# Patient Record
Sex: Female | Born: 1975
Health system: Southern US, Community
[De-identification: ages and names within clinical notes are randomized; demographics above are authoritative.]

## PROBLEM LIST (undated history)

## (undated) DIAGNOSIS — G4730 Sleep apnea, unspecified: Secondary | ICD-10-CM

## (undated) HISTORY — PX: TONSILLECTOMY AND ADENOIDECTOMY: SUR1326

## (undated) HISTORY — DX: Sleep apnea, unspecified: G47.30

---

## 2020-01-28 DIAGNOSIS — G4733 Obstructive sleep apnea (adult) (pediatric): Secondary | ICD-10-CM | POA: Diagnosis not present

## 2020-05-04 DIAGNOSIS — G4733 Obstructive sleep apnea (adult) (pediatric): Secondary | ICD-10-CM | POA: Diagnosis not present

## 2020-06-19 DIAGNOSIS — G4733 Obstructive sleep apnea (adult) (pediatric): Secondary | ICD-10-CM | POA: Diagnosis not present

## 2020-08-03 DIAGNOSIS — G4733 Obstructive sleep apnea (adult) (pediatric): Secondary | ICD-10-CM | POA: Diagnosis not present

## 2020-09-05 DIAGNOSIS — G4733 Obstructive sleep apnea (adult) (pediatric): Secondary | ICD-10-CM | POA: Diagnosis not present

## 2020-10-23 ENCOUNTER — Other Ambulatory Visit (HOSPITAL_COMMUNITY)
Admission: RE | Admit: 2020-10-23 | Discharge: 2020-10-23 | Disposition: A | Discharge status: Home / Self Care | Payer: BC Managed Care – PPO | Source: Ambulatory Visit | Attending: Obstetrics & Gynecology | Admitting: Obstetrics & Gynecology

## 2020-10-23 ENCOUNTER — Encounter: Payer: Self-pay | Admitting: Obstetrics & Gynecology

## 2020-10-23 ENCOUNTER — Other Ambulatory Visit: Payer: Self-pay

## 2020-10-23 ENCOUNTER — Ambulatory Visit (INDEPENDENT_AMBULATORY_CARE_PROVIDER_SITE_OTHER): Payer: BC Managed Care – PPO | Admitting: Obstetrics & Gynecology

## 2020-10-23 VITALS — BP 111/72 | HR 80 | Ht 68.0 in | Wt 249.0 lb

## 2020-10-23 DIAGNOSIS — Z01419 Encounter for gynecological examination (general) (routine) without abnormal findings: Secondary | ICD-10-CM | POA: Insufficient documentation

## 2020-10-23 DIAGNOSIS — Z1231 Encounter for screening mammogram for malignant neoplasm of breast: Secondary | ICD-10-CM

## 2020-10-23 DIAGNOSIS — N926 Irregular menstruation, unspecified: Secondary | ICD-10-CM | POA: Diagnosis not present

## 2020-10-23 DIAGNOSIS — Z3009 Encounter for other general counseling and advice on contraception: Secondary | ICD-10-CM

## 2020-10-23 DIAGNOSIS — Z6837 Body mass index (BMI) 37.0-37.9, adult: Secondary | ICD-10-CM | POA: Diagnosis not present

## 2020-10-23 DIAGNOSIS — Z713 Dietary counseling and surveillance: Secondary | ICD-10-CM

## 2020-10-23 LAB — POCT URINE PREGNANCY: Preg Test, Ur: NEGATIVE

## 2020-10-23 NOTE — Progress Notes (Signed)
Subjective:     Glenda Baker is a 45 y.o. female here for a routine exam. LMP 12/3/202. Pt reports that she normally has monthly cycles but in the past 6 months she has had 2 cycles that were delayed. Her normal cycle length is 3 days but, on those 2 episodes her cycle lasted 6 days. Her cycles are normally light. She is a [id number] with h/o SVD x3. She breast fed all of her kids for on average 11 months.    Pt moved from [place] due to her husbands job. She is a Scientist, research (life sciences). She reports that she has used natural family planning for contraception but, over the past 6 months, she has had 2 delayed menses. She is concerned as they are not in a place where they want to conceive. Pt reports a weight gain of 25 pounds since the COVID pandemic began.     Pts friend was recently dx'd with breast cancer. She wants to be screened. Pt denies family history of any female malignancies.       Gynecologic History Patient's last menstrual period was 09/01/2020. Contraception: Natrural Family Planning/rhythm   Last Pap: 8 years prev. Results were: normal Last mammogram: never had.   Obstetric History OB History  Gravida Para Term Preterm AB Living  3 3 3     3   SAB IAB Ectopic Multiple Live Births          3    # Outcome Date GA Lbr Len/2nd Weight Sex Delivery Anes PTL Lv  3 Term 2013 [id number]   M Vag-Spont None N LIV  2 Term 2011 [redacted]w[redacted]d   F Vag-Spont None N LIV  1 Term 2010 [redacted]w[redacted]d   F Vag-Spont None N LIV   The following portions of the patient's history were reviewed and updated as appropriate: allergies, current medications, past family history, past medical history, past social history, past surgical history and problem list.  Review of Systems Pertinent items are noted in HPI.    Objective:  BP 111/72   Pulse 80   Ht 5\' 8"  (1.727 m)   Wt 249 lb (112.9 kg)   LMP 09/01/2020   BMI 37.86 kg/m  General Appearance:    Alert, cooperative, no distress, appears stated age  Head:     Normocephalic, without obvious abnormality, atraumatic  Eyes:    conjunctiva/corneas clear, EOM's intact, both eyes  Ears:    Normal external ear canals, both ears  Nose:   Nares normal, septum midline, mucosa normal, no drainage    or sinus tenderness  Throat:   Lips, mucosa, and tongue normal; teeth and gums normal  Neck:   Supple, symmetrical, trachea midline, no adenopathy;    thyroid:  no enlargement/tenderness/nodules  Back:     Symmetric, no curvature, ROM normal, no CVA tenderness  Lungs:     respirations unlabored  Chest Wall:    No tenderness or deformity   Heart:    Regular rate and rhythm  Breast Exam:    No tenderness, masses, or nipple abnormality  Abdomen:     Soft, non-tender, bowel sounds active all four quadrants,    no masses, no organomegaly  Genitalia:    Normal female without lesion, discharge or tenderness     Extremities:   Extremities normal, atraumatic, no cyanosis or edema  Pulses:   2+ and symmetric all extremities  Skin:   Skin color, texture, turgor normal, no rashes or lesions      Assessment:  Healthy female exam.   AUB/irreg cycles- UPT neg Contraception counseling BMI of >37   Plan:   Glenda Baker was seen today for gynecologic exam.  Diagnoses and all orders for this visit:  Well female exam with routine gynecological exam -     Cytology - PAP( [place]) -     Lipid Profile -     CBC -     TSH+Prl+TestT+TestF+17OHP -     Hemoglobin A1c -     FSH  Breast cancer screening by mammogram -     Cancel: MM DIGITAL SCREENING BILATERAL; Future -     MM 3D SCREEN BREAST BILATERAL; Future  Abnormal menstrual periods -     POCT urine pregnancy -     Lipid Profile -     CBC -     TSH+Prl+TestT+TestF+17OHP -     Hemoglobin A1c -     FSH  BMI 37.0-37.9, adult  Encounter for counseling regarding contraception  Weight loss counseling, encounter for  f/u in 2 weeks to review labs and readdress cycles. Pt plans to sign up for [employer].  F/u in 1 year for annual   Glenda Baker, M.D., [place] [place]

## 2020-10-24 LAB — CYTOLOGY - PAP
Comment: NEGATIVE
Diagnosis: NEGATIVE
High risk HPV: NEGATIVE

## 2020-10-28 LAB — CBC
Hematocrit: 38.3 % (ref 34.0–46.6)
Hemoglobin: 13.4 g/dL (ref 11.1–15.9)
MCH: 30.2 pg (ref 26.6–33.0)
MCHC: 35 g/dL (ref 31.5–35.7)
MCV: 86 fL (ref 79–97)
Platelets: 372 10*3/uL (ref 150–450)
RBC: 4.44 x10E6/uL (ref 3.77–5.28)
RDW: 12.5 % (ref 11.7–15.4)
WBC: 8.8 10*3/uL (ref 3.4–10.8)

## 2020-10-28 LAB — TSH+PRL+TESTT+TESTF+17OHP
17-Hydroxyprogesterone: 121 ng/dL
Prolactin: 13.3 ng/mL (ref 4.8–23.3)
TSH: 3.44 u[IU]/mL (ref 0.450–4.500)
Testosterone, Free: 3.2 pg/mL (ref 0.0–4.2)
Testosterone, Total, LC/MS: 16.3 ng/dL

## 2020-10-28 LAB — HEMOGLOBIN A1C
Est. average glucose Bld gHb Est-mCnc: 114 mg/dL
Hgb A1c MFr Bld: 5.6 % (ref 4.8–5.6)

## 2020-10-28 LAB — LIPID PANEL
Chol/HDL Ratio: 4.1 ratio (ref 0.0–4.4)
Cholesterol, Total: 168 mg/dL (ref 100–199)
HDL: 41 mg/dL (ref >39)
LDL Chol Calc (NIH): 103 mg/dL — ABNORMAL HIGH (ref 0–99)
Triglycerides: 132 mg/dL (ref 0–149)
VLDL Cholesterol Cal: 24 mg/dL (ref 5–40)

## 2020-10-28 LAB — FOLLICLE STIMULATING HORMONE: FSH: 2.6 m[IU]/mL

## 2020-10-30 ENCOUNTER — Encounter (HOSPITAL_BASED_OUTPATIENT_CLINIC_OR_DEPARTMENT_OTHER): Payer: Self-pay

## 2020-10-30 ENCOUNTER — Ambulatory Visit (HOSPITAL_BASED_OUTPATIENT_CLINIC_OR_DEPARTMENT_OTHER)
Admission: RE | Admit: 2020-10-30 | Discharge: 2020-10-30 | Disposition: A | Discharge status: Home / Self Care | Payer: BC Managed Care – PPO | Source: Ambulatory Visit | Attending: Obstetrics & Gynecology | Admitting: Obstetrics & Gynecology

## 2020-10-30 ENCOUNTER — Other Ambulatory Visit: Payer: Self-pay

## 2020-10-30 DIAGNOSIS — Z1231 Encounter for screening mammogram for malignant neoplasm of breast: Secondary | ICD-10-CM | POA: Diagnosis not present

## 2020-12-06 DIAGNOSIS — G4733 Obstructive sleep apnea (adult) (pediatric): Secondary | ICD-10-CM | POA: Diagnosis not present

## 2021-03-09 DIAGNOSIS — G4733 Obstructive sleep apnea (adult) (pediatric): Secondary | ICD-10-CM | POA: Diagnosis not present

## 2021-06-11 DIAGNOSIS — G4733 Obstructive sleep apnea (adult) (pediatric): Secondary | ICD-10-CM | POA: Diagnosis not present

## 2021-09-11 DIAGNOSIS — G4733 Obstructive sleep apnea (adult) (pediatric): Secondary | ICD-10-CM | POA: Diagnosis not present

## 2021-10-23 ENCOUNTER — Encounter: Payer: Self-pay | Admitting: Obstetrics & Gynecology

## 2021-10-24 ENCOUNTER — Other Ambulatory Visit: Payer: Self-pay

## 2021-10-24 ENCOUNTER — Encounter: Payer: Self-pay | Admitting: Obstetrics & Gynecology

## 2021-10-24 ENCOUNTER — Ambulatory Visit (INDEPENDENT_AMBULATORY_CARE_PROVIDER_SITE_OTHER): Payer: BC Managed Care – PPO | Admitting: Obstetrics & Gynecology

## 2021-10-24 VITALS — BP 112/72 | HR 68 | Ht 68.0 in | Wt 240.0 lb

## 2021-10-24 DIAGNOSIS — B3731 Acute candidiasis of vulva and vagina: Secondary | ICD-10-CM | POA: Diagnosis not present

## 2021-10-24 DIAGNOSIS — Z1231 Encounter for screening mammogram for malignant neoplasm of breast: Secondary | ICD-10-CM | POA: Diagnosis not present

## 2021-10-24 DIAGNOSIS — Z01419 Encounter for gynecological examination (general) (routine) without abnormal findings: Secondary | ICD-10-CM

## 2021-10-24 MED ORDER — FLUCONAZOLE 150 MG PO TABS: 150.0000 mg | ORAL_TABLET | Freq: Once | ORAL | 3 refills | Status: AC

## 2021-10-24 NOTE — Progress Notes (Signed)
Patient complaining of vaginal itching 

## 2021-10-24 NOTE — Progress Notes (Signed)
Subjective:     Glenda Baker is a 46 y.o. female here for a routine exam.  Current complaints: Pt reports some vulvar itching.       Gynecologic History No LMP recorded. (Menstrual status: Irregular Periods). Contraception: rhythm method Last Pap: 10/23/2020. Results were: normal Last mammogram: 10/30/2020. Results were: normal  Obstetric History OB History  Gravida Para Term Preterm AB Living  3 3 3     3   SAB IAB Ectopic Multiple Live Births          3    # Outcome Date GA Lbr Len/2nd Weight Sex Delivery Anes PTL Lv  3 Term 2013 [redacted]w[redacted]d   M Vag-Spont None N LIV  2 Term 2011 [redacted]w[redacted]d   F Vag-Spont None N LIV  1 Term 2010 [redacted]w[redacted]d   F Vag-Spont None N LIV     The following portions of the patient's history were reviewed and updated as appropriate: allergies, current medications, past family history, past medical history, past social history, past surgical history, and problem list.  Review of Systems Pertinent items are noted in HPI.    Objective:  BP 112/72    Pulse 68    Ht 5\' 8"  (1.727 m)    Wt 240 lb (108.9 kg)    BMI 36.49 kg/m   General Appearance:    Alert, cooperative, no distress, appears stated age  Head:    Normocephalic, without obvious abnormality, atraumatic  Eyes:    conjunctiva/corneas clear, EOM's intact, both eyes  Ears:    Normal external ear canals, both ears  Nose:   Nares normal, septum midline, mucosa normal, no drainage    or sinus tenderness  Throat:   Lips, mucosa, and tongue normal; teeth and gums normal  Neck:   Supple, symmetrical, trachea midline, no adenopathy;    thyroid:  no enlargement/tenderness/nodules  Back:     Symmetric, no curvature, ROM normal, no CVA tenderness  Lungs:     respirations unlabored  Chest Wall:    No tenderness or deformity   Heart:    Regular rate and rhythm  Breast Exam:    No tenderness, masses, or nipple abnormality  Abdomen:     Soft, non-tender, bowel sounds active all four quadrants,    no masses, no organomegaly   Genitalia:    Normal female without lesion, discharge or tenderness   White curd like vaginal discharge     Extremities:   Extremities normal, atraumatic, no cyanosis or edema  Pulses:   2+ and symmetric all extremities  Skin:   Skin color, texture, turgor normal, no rashes or lesions     Assessment:    Healthy female exam.  Yeast vulvovaginits.     Plan:   Diagnoses and all orders for this visit:  Well female exam with routine gynecological exam -     Hemoglobin A1c -     Comprehensive metabolic panel -     CBC  Vulvovaginitis due to yeast -     fluconazole (DIFLUCAN) 150 MG tablet; Take 1 tablet (150 mg total) by mouth once for 1 dose. Can take additional dose three days later if symptoms persist   Anti fungal powder on upper thigh.    Machaela Caterino L. Harraway-Smith, M.D., [redacted]w[redacted]d

## 2021-10-25 LAB — COMPREHENSIVE METABOLIC PANEL WITH GFR
ALT: 13 [IU]/L (ref 0–32)
AST: 11 [IU]/L (ref 0–40)
Albumin/Globulin Ratio: 2.3 — ABNORMAL HIGH (ref 1.2–2.2)
Albumin: 4.5 g/dL (ref 3.8–4.8)
Alkaline Phosphatase: 65 [IU]/L (ref 44–121)
BUN/Creatinine Ratio: 17 (ref 9–23)
BUN: 17 mg/dL (ref 6–24)
Bilirubin Total: 0.2 mg/dL (ref 0.0–1.2)
CO2: 25 mmol/L (ref 20–29)
Calcium: 9.3 mg/dL (ref 8.7–10.2)
Chloride: 103 mmol/L (ref 96–106)
Creatinine, Ser: 0.99 mg/dL (ref 0.57–1.00)
Globulin, Total: 2 g/dL (ref 1.5–4.5)
Glucose: 79 mg/dL (ref 70–99)
Potassium: 4.6 mmol/L (ref 3.5–5.2)
Sodium: 139 mmol/L (ref 134–144)
Total Protein: 6.5 g/dL (ref 6.0–8.5)
eGFR: 73 mL/min/{1.73_m2}

## 2021-10-25 LAB — CBC
Hematocrit: 38.8 % (ref 34.0–46.6)
Hemoglobin: 13.5 g/dL (ref 11.1–15.9)
MCH: 30.1 pg (ref 26.6–33.0)
MCHC: 34.8 g/dL (ref 31.5–35.7)
MCV: 86 fL (ref 79–97)
Platelets: 426 10*3/uL (ref 150–450)
RBC: 4.49 x10E6/uL (ref 3.77–5.28)
RDW: 12.7 % (ref 11.7–15.4)
WBC: 10.7 10*3/uL (ref 3.4–10.8)

## 2021-10-25 LAB — HEMOGLOBIN A1C
Est. average glucose Bld gHb Est-mCnc: 117 mg/dL
Hgb A1c MFr Bld: 5.7 % — ABNORMAL HIGH (ref 4.8–5.6)

## 2021-11-01 DIAGNOSIS — G4733 Obstructive sleep apnea (adult) (pediatric): Secondary | ICD-10-CM | POA: Diagnosis not present

## 2021-11-05 ENCOUNTER — Other Ambulatory Visit: Payer: Self-pay | Admitting: Obstetrics & Gynecology

## 2021-11-05 ENCOUNTER — Ambulatory Visit (HOSPITAL_BASED_OUTPATIENT_CLINIC_OR_DEPARTMENT_OTHER)
Admission: RE | Admit: 2021-11-05 | Discharge: 2021-11-05 | Disposition: A | Discharge status: Home / Self Care | Payer: BC Managed Care – PPO | Source: Ambulatory Visit | Attending: Obstetrics & Gynecology | Admitting: Obstetrics & Gynecology

## 2021-11-05 ENCOUNTER — Other Ambulatory Visit: Payer: Self-pay

## 2021-11-05 ENCOUNTER — Encounter (HOSPITAL_BASED_OUTPATIENT_CLINIC_OR_DEPARTMENT_OTHER): Payer: Self-pay

## 2021-11-05 DIAGNOSIS — Z1231 Encounter for screening mammogram for malignant neoplasm of breast: Secondary | ICD-10-CM | POA: Diagnosis not present

## 2021-11-05 DIAGNOSIS — B3731 Acute candidiasis of vulva and vagina: Secondary | ICD-10-CM

## 2021-11-05 MED ORDER — FLUCONAZOLE 150 MG PO TABS: 150.0000 mg | ORAL_TABLET | Freq: Once | ORAL | 1 refills | Status: AC

## 2021-11-06 ENCOUNTER — Encounter: Payer: Self-pay | Admitting: Obstetrics & Gynecology

## 2021-11-06 ENCOUNTER — Telehealth: Payer: Self-pay

## 2021-11-06 DIAGNOSIS — R7303 Prediabetes: Secondary | ICD-10-CM | POA: Insufficient documentation

## 2021-11-06 NOTE — Telephone Encounter (Signed)
-----   Message from Willodean Rosenthal, MD sent at 11/06/2021  7:48 AM EST ----- Please call pt. Her HgbA1C is right on the border of prediabetes. She needs to f/u with her primary care doc. If she doesn't have one, please give referral.   Thanks,   Clh-S

## 2021-11-06 NOTE — Telephone Encounter (Signed)
Phone rang several times and then disconnected. Unable to leave message. Will send my chart message.  Armandina Stammer RN

## 2021-12-03 IMAGING — MG MM DIGITAL SCREENING BILAT W/ TOMO AND CAD
8 series · 8 of 24 positions shown · non-contrast
Comparison: None.

CLINICAL DATA: Screening.

EXAM:
DIGITAL SCREENING BILATERAL MAMMOGRAM WITH TOMO AND CAD

[L CC synth-2D]
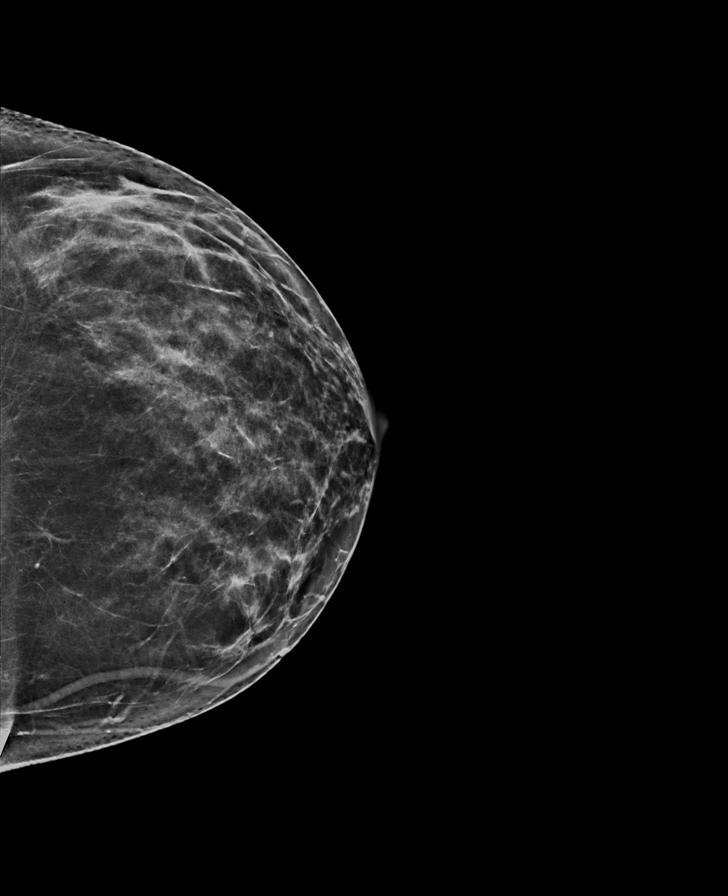

[L MLO synth-2D]
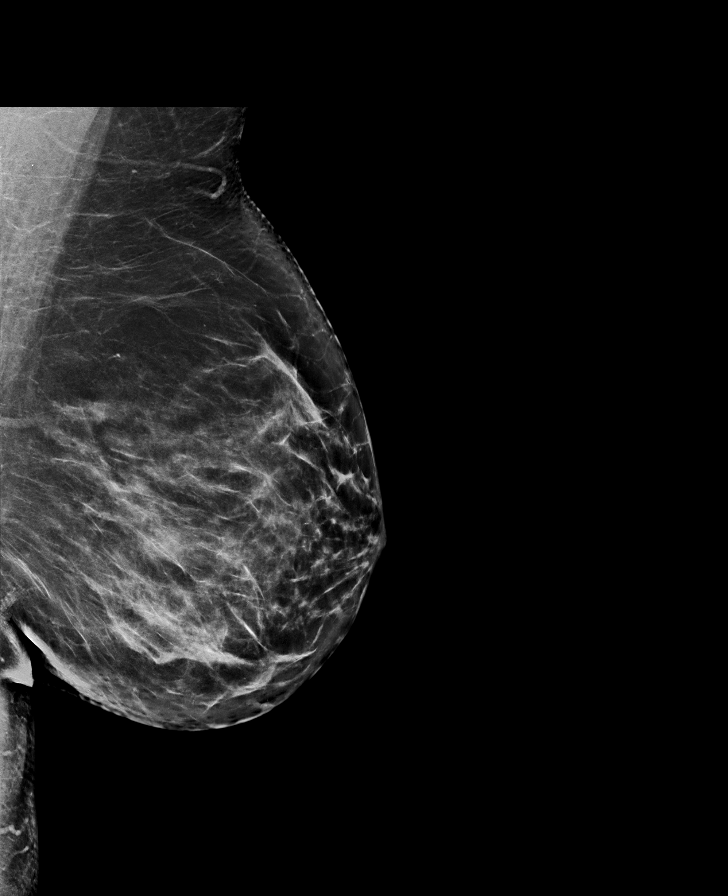

[R CC synth-2D]
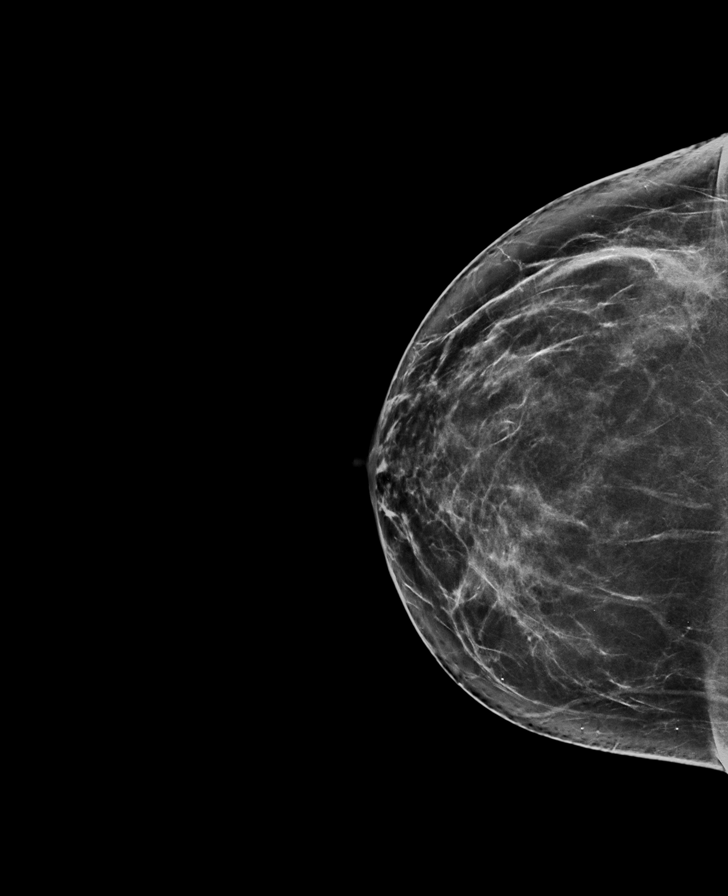

[R MLO synth-2D]
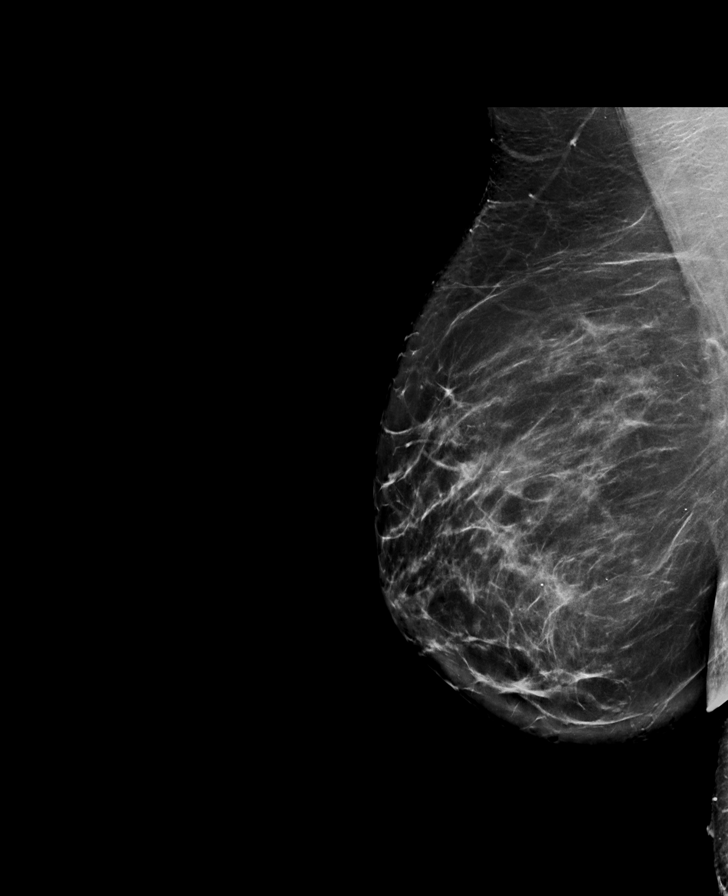

[R CC tomo · tomo slice 39/78.0]
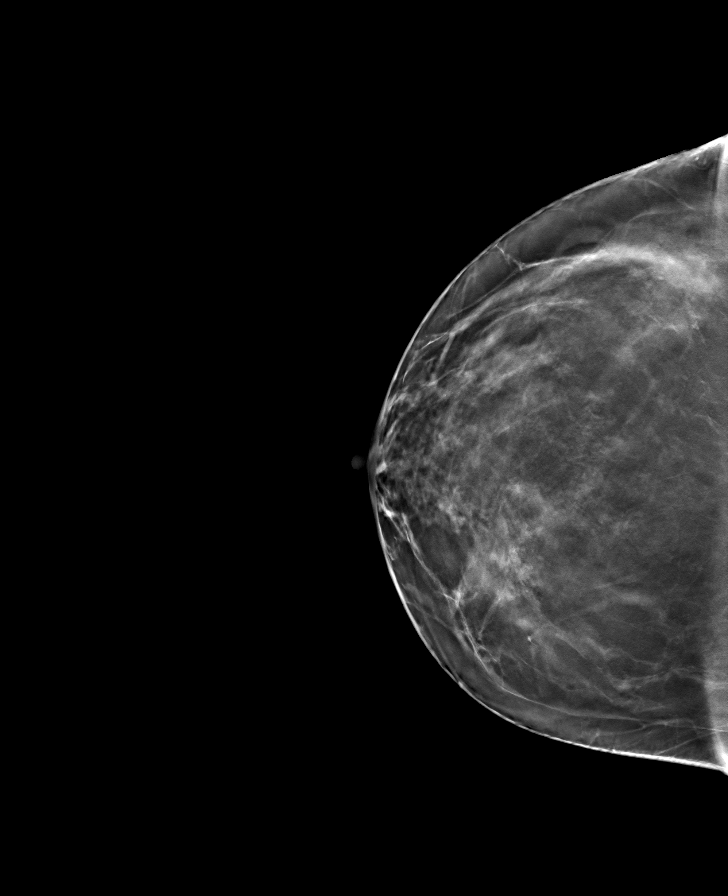

[R MLO tomo · tomo slice 47/92.0]
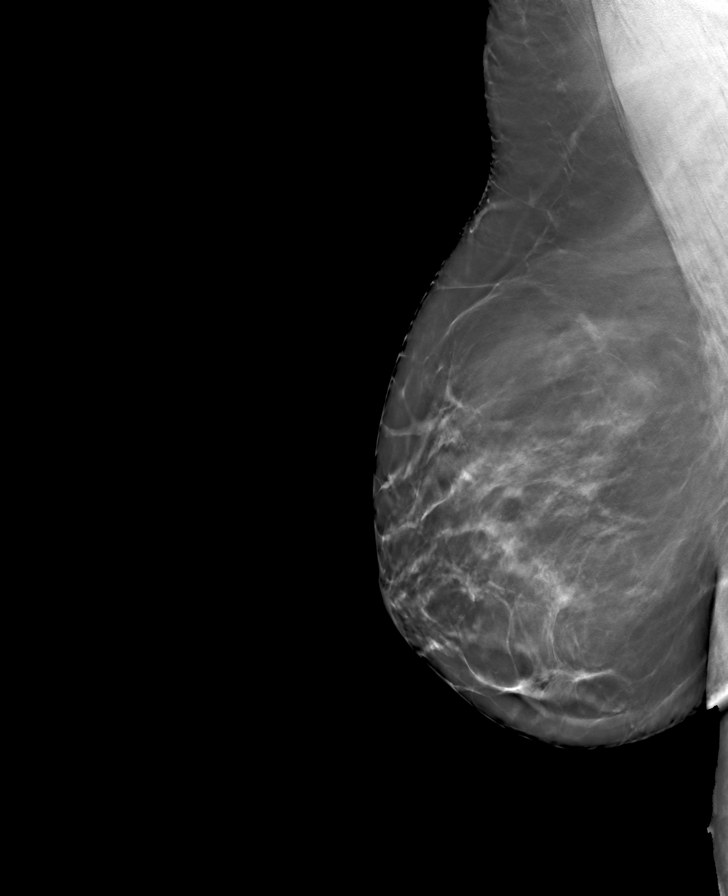

[L CC tomo · tomo slice 37/72.0]
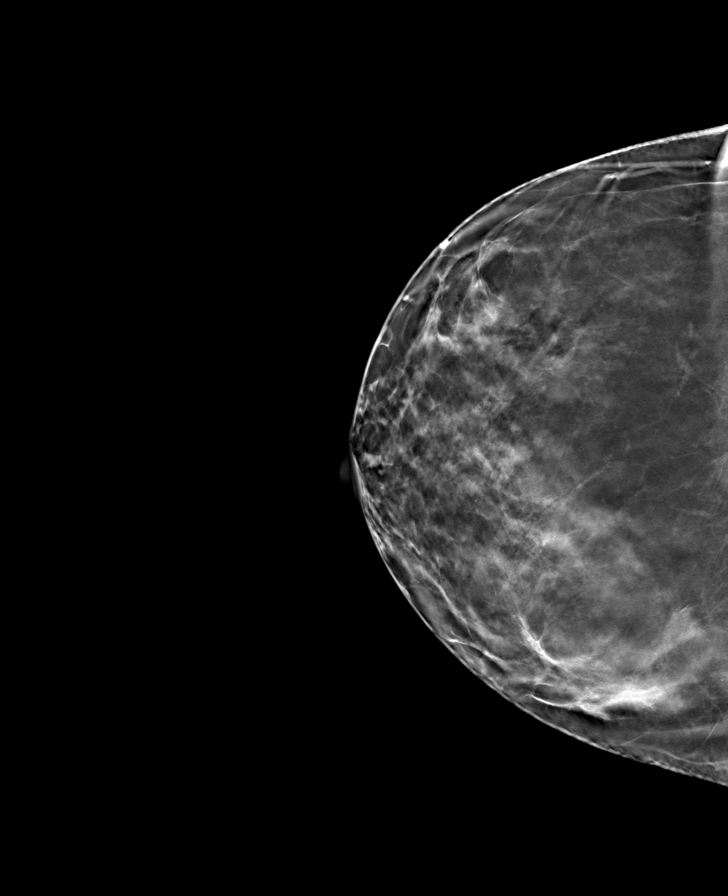

[L MLO tomo · tomo slice 45/90.0]
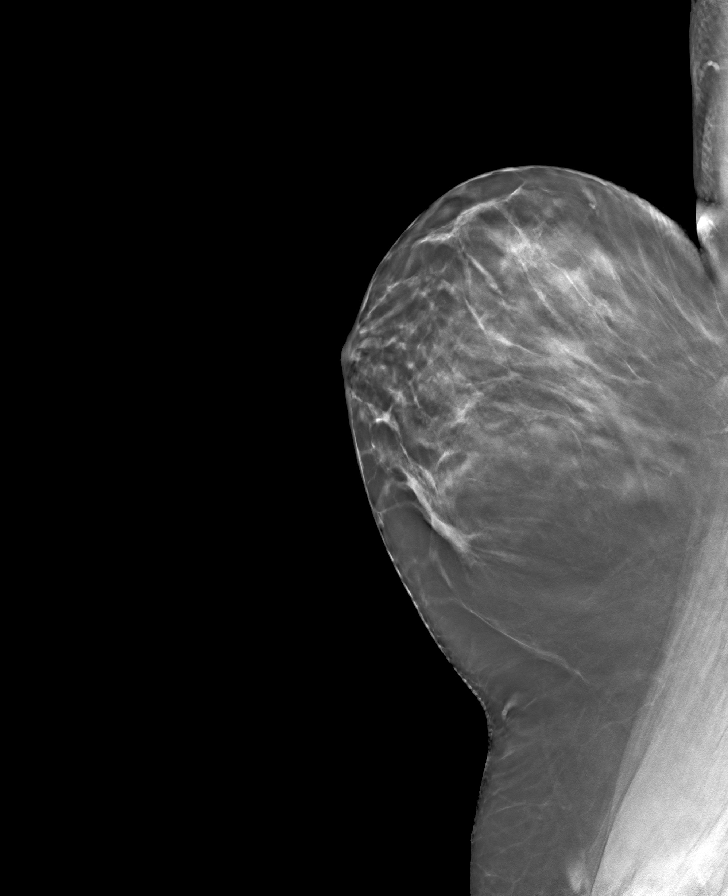

[8 of 24 positions shown; findings below may reference images not displayed]

ACR Breast Density Category c: The breast tissue is heterogeneously
dense, which may obscure small masses
FINDINGS: There are no findings suspicious for malignancy. The images were
evaluated with computer-aided detection.
IMPRESSION: No mammographic evidence of malignancy. A result letter of this
screening mammogram will be mailed directly to the patient.

RECOMMENDATION:
Screening mammogram in one year. (Code:FI-R-51M)

BI-RADS CATEGORY  1: Negative.

## 2022-01-29 DIAGNOSIS — G4733 Obstructive sleep apnea (adult) (pediatric): Secondary | ICD-10-CM | POA: Diagnosis not present

## 2022-06-07 DIAGNOSIS — G4733 Obstructive sleep apnea (adult) (pediatric): Secondary | ICD-10-CM | POA: Diagnosis not present

## 2022-06-10 DIAGNOSIS — G4733 Obstructive sleep apnea (adult) (pediatric): Secondary | ICD-10-CM | POA: Diagnosis not present

## 2022-08-14 ENCOUNTER — Encounter: Payer: Self-pay | Admitting: General Practice

## 2023-03-18 DIAGNOSIS — G4733 Obstructive sleep apnea (adult) (pediatric): Secondary | ICD-10-CM | POA: Diagnosis not present

## 2023-04-28 ENCOUNTER — Ambulatory Visit: Payer: BC Managed Care – PPO | Admitting: Family Medicine

## 2023-06-18 DIAGNOSIS — G4733 Obstructive sleep apnea (adult) (pediatric): Secondary | ICD-10-CM | POA: Diagnosis not present

## 2023-06-24 ENCOUNTER — Other Ambulatory Visit: Payer: Self-pay | Admitting: Obstetrics & Gynecology

## 2023-06-24 DIAGNOSIS — Z1231 Encounter for screening mammogram for malignant neoplasm of breast: Secondary | ICD-10-CM

## 2023-06-25 ENCOUNTER — Ambulatory Visit: Payer: BC Managed Care – PPO

## 2023-06-25 DIAGNOSIS — Z1231 Encounter for screening mammogram for malignant neoplasm of breast: Secondary | ICD-10-CM | POA: Diagnosis not present

## 2023-06-27 ENCOUNTER — Other Ambulatory Visit: Payer: Self-pay | Admitting: Obstetrics & Gynecology

## 2023-06-27 DIAGNOSIS — R928 Other abnormal and inconclusive findings on diagnostic imaging of breast: Secondary | ICD-10-CM

## 2023-07-09 ENCOUNTER — Ambulatory Visit
Admission: RE | Admit: 2023-07-09 | Discharge: 2023-07-09 | Disposition: A | Discharge status: Home / Self Care | Payer: BC Managed Care – PPO | Source: Ambulatory Visit | Attending: Obstetrics & Gynecology | Admitting: Obstetrics & Gynecology

## 2023-07-09 DIAGNOSIS — R928 Other abnormal and inconclusive findings on diagnostic imaging of breast: Secondary | ICD-10-CM | POA: Diagnosis not present

## 2023-10-29 DIAGNOSIS — G4733 Obstructive sleep apnea (adult) (pediatric): Secondary | ICD-10-CM | POA: Diagnosis not present

## 2023-12-02 DIAGNOSIS — G4733 Obstructive sleep apnea (adult) (pediatric): Secondary | ICD-10-CM | POA: Diagnosis not present

## 2024-05-13 DIAGNOSIS — G4733 Obstructive sleep apnea (adult) (pediatric): Secondary | ICD-10-CM | POA: Diagnosis not present

## 2024-06-23 DIAGNOSIS — G4733 Obstructive sleep apnea (adult) (pediatric): Secondary | ICD-10-CM | POA: Diagnosis not present

## 2024-08-04 ENCOUNTER — Other Ambulatory Visit: Payer: Self-pay | Admitting: Family Medicine

## 2024-08-04 DIAGNOSIS — Z1231 Encounter for screening mammogram for malignant neoplasm of breast: Secondary | ICD-10-CM

## 2024-08-30 ENCOUNTER — Ambulatory Visit
Admission: RE | Admit: 2024-08-30 | Discharge: 2024-08-30 | Disposition: A | Source: Ambulatory Visit | Attending: Family Medicine | Admitting: Family Medicine

## 2024-08-30 DIAGNOSIS — Z1231 Encounter for screening mammogram for malignant neoplasm of breast: Secondary | ICD-10-CM

## 2025-01-14 ENCOUNTER — Ambulatory Visit: Admitting: Urgent Care
